# Patient Record
Sex: Female | Born: 1965 | Race: White | Hispanic: No
Health system: Southern US, Community
[De-identification: ages and names within clinical notes are randomized; demographics above are authoritative.]

## PROBLEM LIST (undated history)

## (undated) DIAGNOSIS — J45909 Unspecified asthma, uncomplicated: Secondary | ICD-10-CM

## (undated) DIAGNOSIS — F909 Attention-deficit hyperactivity disorder, unspecified type: Secondary | ICD-10-CM

## (undated) HISTORY — PX: ABDOMINAL HYSTERECTOMY: SHX81

---

## 2017-06-13 DIAGNOSIS — K589 Irritable bowel syndrome without diarrhea: Secondary | ICD-10-CM | POA: Diagnosis not present

## 2017-06-13 DIAGNOSIS — F909 Attention-deficit hyperactivity disorder, unspecified type: Secondary | ICD-10-CM | POA: Diagnosis not present

## 2017-12-12 DIAGNOSIS — K589 Irritable bowel syndrome without diarrhea: Secondary | ICD-10-CM | POA: Diagnosis not present

## 2017-12-12 DIAGNOSIS — F909 Attention-deficit hyperactivity disorder, unspecified type: Secondary | ICD-10-CM | POA: Diagnosis not present

## 2018-03-13 DIAGNOSIS — Z3202 Encounter for pregnancy test, result negative: Secondary | ICD-10-CM | POA: Diagnosis not present

## 2018-03-13 DIAGNOSIS — N390 Urinary tract infection, site not specified: Secondary | ICD-10-CM | POA: Diagnosis not present

## 2018-03-21 DIAGNOSIS — H6692 Otitis media, unspecified, left ear: Secondary | ICD-10-CM | POA: Diagnosis not present

## 2018-05-17 DIAGNOSIS — N390 Urinary tract infection, site not specified: Secondary | ICD-10-CM | POA: Diagnosis not present

## 2018-07-22 DIAGNOSIS — Z23 Encounter for immunization: Secondary | ICD-10-CM | POA: Diagnosis not present

## 2018-07-22 DIAGNOSIS — F909 Attention-deficit hyperactivity disorder, unspecified type: Secondary | ICD-10-CM | POA: Diagnosis not present

## 2018-07-22 DIAGNOSIS — K589 Irritable bowel syndrome without diarrhea: Secondary | ICD-10-CM | POA: Diagnosis not present

## 2018-07-22 DIAGNOSIS — G43909 Migraine, unspecified, not intractable, without status migrainosus: Secondary | ICD-10-CM | POA: Diagnosis not present

## 2018-11-20 DIAGNOSIS — K589 Irritable bowel syndrome without diarrhea: Secondary | ICD-10-CM | POA: Diagnosis not present

## 2018-11-20 DIAGNOSIS — G43909 Migraine, unspecified, not intractable, without status migrainosus: Secondary | ICD-10-CM | POA: Diagnosis not present

## 2018-11-20 DIAGNOSIS — Z23 Encounter for immunization: Secondary | ICD-10-CM | POA: Diagnosis not present

## 2018-11-20 DIAGNOSIS — Z0000 Encounter for general adult medical examination without abnormal findings: Secondary | ICD-10-CM | POA: Diagnosis not present

## 2018-11-20 DIAGNOSIS — F909 Attention-deficit hyperactivity disorder, unspecified type: Secondary | ICD-10-CM | POA: Diagnosis not present

## 2018-11-20 DIAGNOSIS — R5383 Other fatigue: Secondary | ICD-10-CM | POA: Diagnosis not present

## 2018-11-27 DIAGNOSIS — Z13220 Encounter for screening for lipoid disorders: Secondary | ICD-10-CM | POA: Diagnosis not present

## 2018-11-27 DIAGNOSIS — Z131 Encounter for screening for diabetes mellitus: Secondary | ICD-10-CM | POA: Diagnosis not present

## 2018-11-27 DIAGNOSIS — R5383 Other fatigue: Secondary | ICD-10-CM | POA: Diagnosis not present

## 2019-01-22 DIAGNOSIS — F909 Attention-deficit hyperactivity disorder, unspecified type: Secondary | ICD-10-CM | POA: Diagnosis not present

## 2019-01-22 DIAGNOSIS — K589 Irritable bowel syndrome without diarrhea: Secondary | ICD-10-CM | POA: Diagnosis not present

## 2019-04-07 DIAGNOSIS — G43909 Migraine, unspecified, not intractable, without status migrainosus: Secondary | ICD-10-CM | POA: Diagnosis not present

## 2019-04-07 DIAGNOSIS — K589 Irritable bowel syndrome without diarrhea: Secondary | ICD-10-CM | POA: Diagnosis not present

## 2019-04-07 DIAGNOSIS — F909 Attention-deficit hyperactivity disorder, unspecified type: Secondary | ICD-10-CM | POA: Diagnosis not present

## 2019-06-02 DIAGNOSIS — R300 Dysuria: Secondary | ICD-10-CM | POA: Diagnosis not present

## 2019-06-21 DIAGNOSIS — N390 Urinary tract infection, site not specified: Secondary | ICD-10-CM | POA: Diagnosis not present

## 2019-07-07 DIAGNOSIS — R350 Frequency of micturition: Secondary | ICD-10-CM | POA: Diagnosis not present

## 2019-07-07 DIAGNOSIS — R300 Dysuria: Secondary | ICD-10-CM | POA: Diagnosis not present

## 2019-07-07 DIAGNOSIS — R3915 Urgency of urination: Secondary | ICD-10-CM | POA: Diagnosis not present

## 2019-07-14 DIAGNOSIS — R3915 Urgency of urination: Secondary | ICD-10-CM | POA: Diagnosis not present

## 2019-07-18 DIAGNOSIS — N390 Urinary tract infection, site not specified: Secondary | ICD-10-CM | POA: Diagnosis not present

## 2019-07-18 DIAGNOSIS — N952 Postmenopausal atrophic vaginitis: Secondary | ICD-10-CM | POA: Diagnosis not present

## 2019-07-18 DIAGNOSIS — Z87442 Personal history of urinary calculi: Secondary | ICD-10-CM | POA: Diagnosis not present

## 2019-07-18 DIAGNOSIS — Z87440 Personal history of urinary (tract) infections: Secondary | ICD-10-CM | POA: Diagnosis not present

## 2019-07-22 DIAGNOSIS — N200 Calculus of kidney: Secondary | ICD-10-CM | POA: Diagnosis not present

## 2019-12-08 DIAGNOSIS — Z0000 Encounter for general adult medical examination without abnormal findings: Secondary | ICD-10-CM | POA: Diagnosis not present

## 2020-03-03 DIAGNOSIS — S30860A Insect bite (nonvenomous) of lower back and pelvis, initial encounter: Secondary | ICD-10-CM | POA: Diagnosis not present

## 2020-06-15 DIAGNOSIS — K589 Irritable bowel syndrome without diarrhea: Secondary | ICD-10-CM | POA: Diagnosis not present

## 2020-06-15 DIAGNOSIS — G43909 Migraine, unspecified, not intractable, without status migrainosus: Secondary | ICD-10-CM | POA: Diagnosis not present

## 2020-06-15 DIAGNOSIS — F909 Attention-deficit hyperactivity disorder, unspecified type: Secondary | ICD-10-CM | POA: Diagnosis not present

## 2020-06-15 DIAGNOSIS — R030 Elevated blood-pressure reading, without diagnosis of hypertension: Secondary | ICD-10-CM | POA: Diagnosis not present

## 2020-12-13 ENCOUNTER — Other Ambulatory Visit: Payer: Self-pay | Admitting: Family Medicine

## 2020-12-13 DIAGNOSIS — Z131 Encounter for screening for diabetes mellitus: Secondary | ICD-10-CM | POA: Diagnosis not present

## 2020-12-13 DIAGNOSIS — Z0000 Encounter for general adult medical examination without abnormal findings: Secondary | ICD-10-CM | POA: Diagnosis not present

## 2020-12-13 DIAGNOSIS — Z1231 Encounter for screening mammogram for malignant neoplasm of breast: Secondary | ICD-10-CM

## 2020-12-13 DIAGNOSIS — Z13220 Encounter for screening for lipoid disorders: Secondary | ICD-10-CM | POA: Diagnosis not present

## 2021-03-13 DIAGNOSIS — Z20822 Contact with and (suspected) exposure to covid-19: Secondary | ICD-10-CM | POA: Diagnosis not present

## 2021-03-15 DIAGNOSIS — J4520 Mild intermittent asthma, uncomplicated: Secondary | ICD-10-CM | POA: Diagnosis not present

## 2021-03-15 DIAGNOSIS — U071 COVID-19: Secondary | ICD-10-CM | POA: Diagnosis not present

## 2021-06-14 DIAGNOSIS — Z1211 Encounter for screening for malignant neoplasm of colon: Secondary | ICD-10-CM | POA: Diagnosis not present

## 2021-06-14 DIAGNOSIS — F909 Attention-deficit hyperactivity disorder, unspecified type: Secondary | ICD-10-CM | POA: Diagnosis not present

## 2021-06-14 DIAGNOSIS — G43909 Migraine, unspecified, not intractable, without status migrainosus: Secondary | ICD-10-CM | POA: Diagnosis not present

## 2021-06-14 DIAGNOSIS — K589 Irritable bowel syndrome without diarrhea: Secondary | ICD-10-CM | POA: Diagnosis not present

## 2021-06-30 ENCOUNTER — Ambulatory Visit
Admission: RE | Admit: 2021-06-30 | Discharge: 2021-06-30 | Disposition: A | Discharge status: Home / Self Care | Payer: BC Managed Care – PPO | Source: Ambulatory Visit | Attending: Family Medicine | Admitting: Family Medicine

## 2021-06-30 ENCOUNTER — Other Ambulatory Visit: Payer: Self-pay | Admitting: Family Medicine

## 2021-06-30 DIAGNOSIS — M25571 Pain in right ankle and joints of right foot: Secondary | ICD-10-CM

## 2021-06-30 DIAGNOSIS — M79671 Pain in right foot: Secondary | ICD-10-CM

## 2021-07-06 ENCOUNTER — Ambulatory Visit: Payer: BC Managed Care – PPO | Admitting: Family Medicine

## 2021-07-07 ENCOUNTER — Ambulatory Visit: Payer: BC Managed Care – PPO | Admitting: Family Medicine

## 2021-07-07 VITALS — Ht 66.0 in | Wt 131.0 lb

## 2021-07-07 DIAGNOSIS — M5416 Radiculopathy, lumbar region: Secondary | ICD-10-CM

## 2021-07-07 DIAGNOSIS — M25559 Pain in unspecified hip: Secondary | ICD-10-CM

## 2021-07-07 NOTE — Progress Notes (Signed)
PCP: Clayborn Heron, MD  Subjective:   HPI: Patient is a 55 y.o. female here for right leg numbness.  Patient reports for the past few months she's had numbness of right leg below the knee with walking. Typically gets this with her walks that are about 15-20 minutes a day. Wearing good shoes, avoids wearing heels. Has history of back issues and dose have some chronic soreness here. Numbness in right leg always below the knee, into toes but not a consistent distribution. Also noticed pain in right hip with rotation of her leg. Radiates down leg some. Has tried stretches, ibuprofen without much relief.  No past medical history on file.  No current outpatient medications on file prior to visit.   No current facility-administered medications on file prior to visit.   Allergies  Allergen Reactions   Codeine Other (See Comments)    Syncopal episodes    Ht 5\' 6"  (1.676 m)   Wt 131 lb (59.4 kg)   BMI 21.14 kg/m   [facility] 07/07/2021  Frequency of aerobic exercise (# of days/week) 7  Average time in minutes 20  Frequency of strengthening activities (# of days/week) 0    No flowsheet data found.      Objective:  Physical Exam:  Gen: NAD, comfortable in exam room  Back: No gross deformity, scoliosis. TTP mildly paraspinal lumbar regions.  No midline or bony TTP. FROM. Strength LEs 5/5 all muscle groups.   2+ MSRs in patellar and achilles tendons, equal bilaterally. Negative SLRs.  Positive slump. Sensation intact to light touch bilaterally.  Right hip: No deformity. FROM with 5/5 strength. No tenderness to palpation. NVI distally. Positive logroll, fadir. Negative faber, and piriformis stretches.   Assessment & Plan:  1. Right leg numbness - consistent with lumbar radiculopathy.  Start aleve, formal physical therapy.  Consider prednisone, rx nsaid, imaging if not improving.  2. Right hip pain - likely 2/2 osteoarthritis, less  likely labral tear.  Add physical therapy for this also.  Aleve, reviewed tylenol, topical medications, supplements.  F/u in 5 weeks for both issues.

## 2021-07-07 NOTE — Patient Instructions (Signed)
Your numbness/tingling is coming from an irritated nerve in your back. Start aleve 2 tabs twice a day with food for pain and inflammation - take this for 7-10 days then as needed. Stay as active as possible - ok to continue walking with this. Physical therapy has been shown to be helpful as well - start this and do home exercises on days you don't go to therapy. Strengthening of low back muscles, abdominal musculature are key for long term pain relief. Follow up with me in 5 weeks.  You are also having pain from your right hip separately from this, most likely due to mild arthritis. We will add therapy for this as well and the aleve will help. Other medicines that can help if needed: Tylenol 500mg  1-2 tabs three times a day for pain. Capsaicin, aspercreme, or biofreeze topically up to four times a day may also help with pain. Some supplements that may help for arthritis: Boswellia extract, curcumin, pycnogenol

## 2021-08-10 ENCOUNTER — Ambulatory Visit: Payer: BC Managed Care – PPO | Admitting: Family Medicine

## 2021-12-28 DIAGNOSIS — Z0000 Encounter for general adult medical examination without abnormal findings: Secondary | ICD-10-CM | POA: Diagnosis not present

## 2022-01-06 DIAGNOSIS — Z131 Encounter for screening for diabetes mellitus: Secondary | ICD-10-CM | POA: Diagnosis not present

## 2022-01-06 DIAGNOSIS — Z13220 Encounter for screening for lipoid disorders: Secondary | ICD-10-CM | POA: Diagnosis not present

## 2022-03-05 DIAGNOSIS — R300 Dysuria: Secondary | ICD-10-CM | POA: Diagnosis not present

## 2022-03-05 DIAGNOSIS — N3090 Cystitis, unspecified without hematuria: Secondary | ICD-10-CM | POA: Diagnosis not present

## 2022-03-05 DIAGNOSIS — N390 Urinary tract infection, site not specified: Secondary | ICD-10-CM | POA: Diagnosis not present

## 2022-04-03 DIAGNOSIS — F900 Attention-deficit hyperactivity disorder, predominantly inattentive type: Secondary | ICD-10-CM | POA: Diagnosis not present

## 2022-04-03 DIAGNOSIS — F419 Anxiety disorder, unspecified: Secondary | ICD-10-CM | POA: Diagnosis not present

## 2022-04-03 DIAGNOSIS — Z79899 Other long term (current) drug therapy: Secondary | ICD-10-CM | POA: Diagnosis not present

## 2022-06-01 DIAGNOSIS — F419 Anxiety disorder, unspecified: Secondary | ICD-10-CM | POA: Diagnosis not present

## 2022-06-01 DIAGNOSIS — F900 Attention-deficit hyperactivity disorder, predominantly inattentive type: Secondary | ICD-10-CM | POA: Diagnosis not present

## 2022-06-30 DIAGNOSIS — F900 Attention-deficit hyperactivity disorder, predominantly inattentive type: Secondary | ICD-10-CM | POA: Diagnosis not present

## 2022-06-30 DIAGNOSIS — F419 Anxiety disorder, unspecified: Secondary | ICD-10-CM | POA: Diagnosis not present

## 2022-08-29 DIAGNOSIS — F419 Anxiety disorder, unspecified: Secondary | ICD-10-CM | POA: Diagnosis not present

## 2022-08-29 DIAGNOSIS — F900 Attention-deficit hyperactivity disorder, predominantly inattentive type: Secondary | ICD-10-CM | POA: Diagnosis not present

## 2022-12-01 DIAGNOSIS — F419 Anxiety disorder, unspecified: Secondary | ICD-10-CM | POA: Diagnosis not present

## 2022-12-01 DIAGNOSIS — F900 Attention-deficit hyperactivity disorder, predominantly inattentive type: Secondary | ICD-10-CM | POA: Diagnosis not present

## 2023-03-01 DIAGNOSIS — F900 Attention-deficit hyperactivity disorder, predominantly inattentive type: Secondary | ICD-10-CM | POA: Diagnosis not present

## 2023-03-01 DIAGNOSIS — F419 Anxiety disorder, unspecified: Secondary | ICD-10-CM | POA: Diagnosis not present

## 2023-03-01 DIAGNOSIS — Z5181 Encounter for therapeutic drug level monitoring: Secondary | ICD-10-CM | POA: Diagnosis not present

## 2023-05-30 DIAGNOSIS — F900 Attention-deficit hyperactivity disorder, predominantly inattentive type: Secondary | ICD-10-CM | POA: Diagnosis not present

## 2023-05-30 DIAGNOSIS — F419 Anxiety disorder, unspecified: Secondary | ICD-10-CM | POA: Diagnosis not present

## 2023-08-28 DIAGNOSIS — F419 Anxiety disorder, unspecified: Secondary | ICD-10-CM | POA: Diagnosis not present

## 2023-08-28 DIAGNOSIS — F900 Attention-deficit hyperactivity disorder, predominantly inattentive type: Secondary | ICD-10-CM | POA: Diagnosis not present

## 2023-09-17 IMAGING — DX DG FOOT COMPLETE 3+V*R*
3 series · 3 of 3 positions shown · non-contrast
Comparison: None.

CLINICAL DATA: Right foot pain. Dropped weight on foot 3 months
ago.

EXAM:
RIGHT FOOT COMPLETE - 3+ VIEW

[dg foot complete right (1 of 3)]
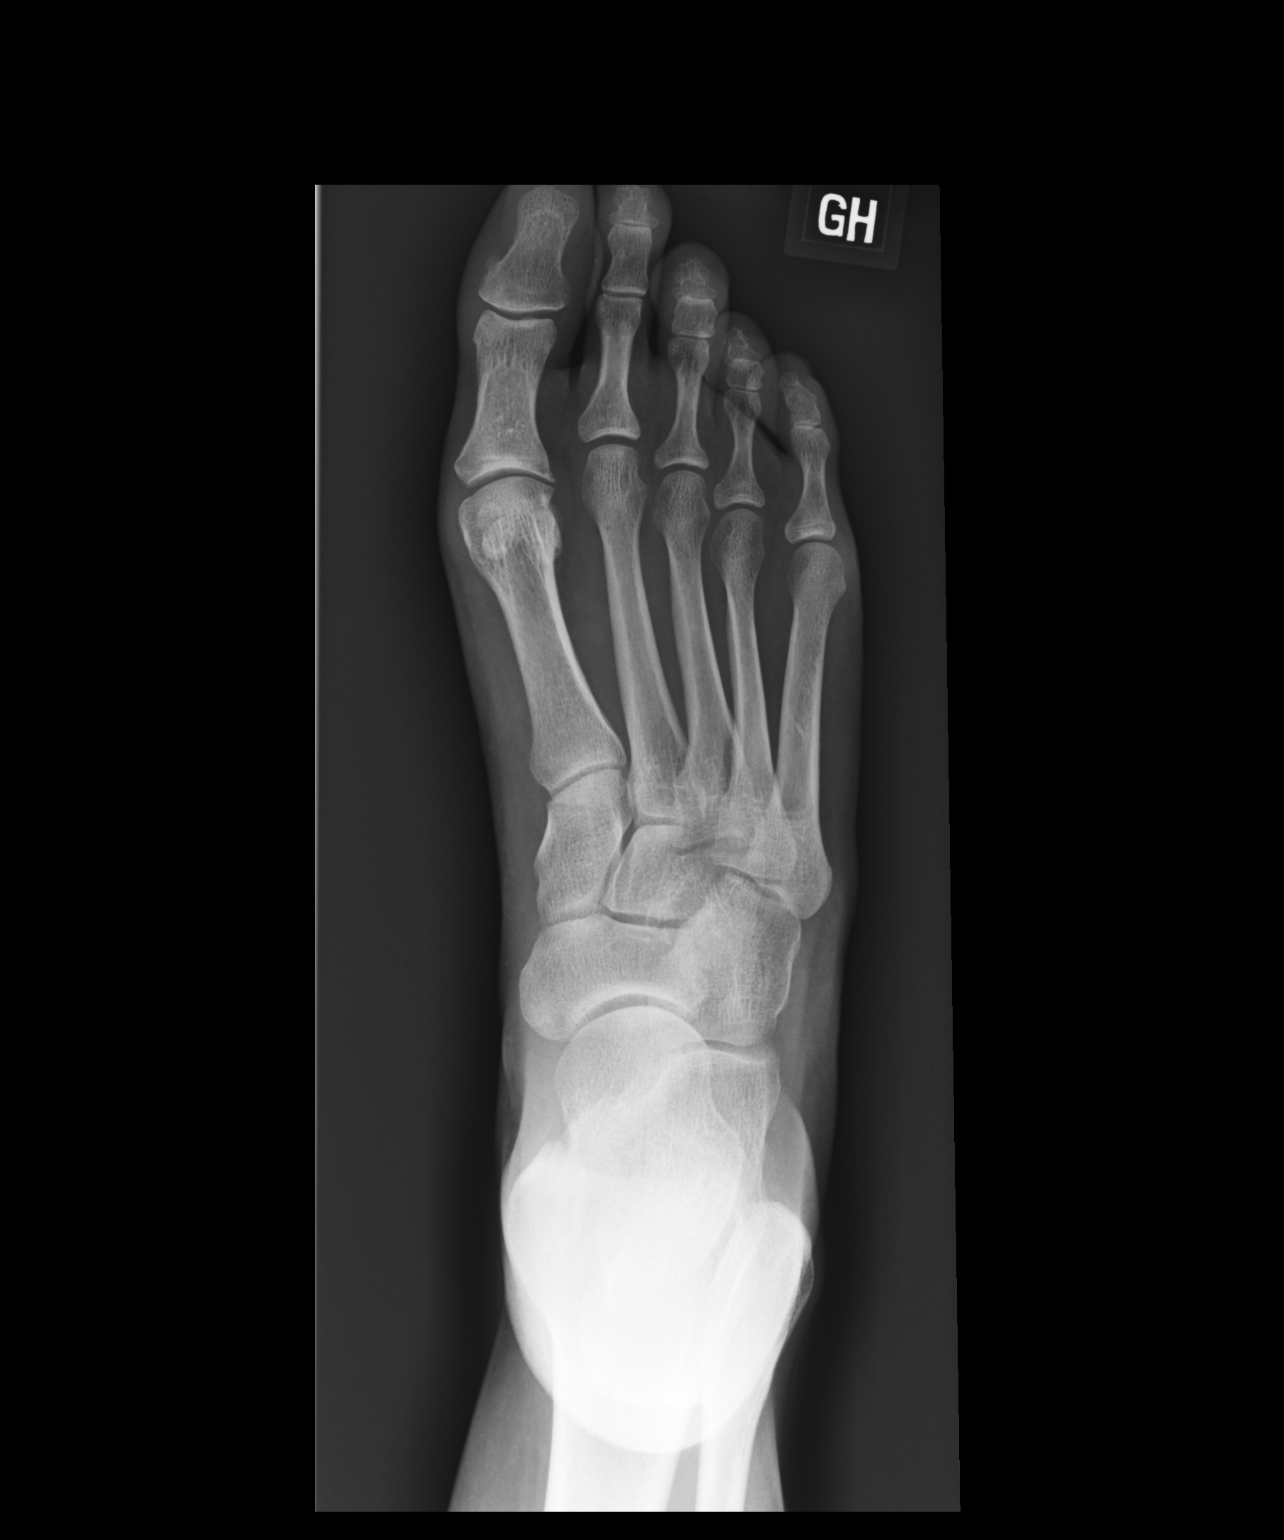

[dg foot complete right (2 of 3)]
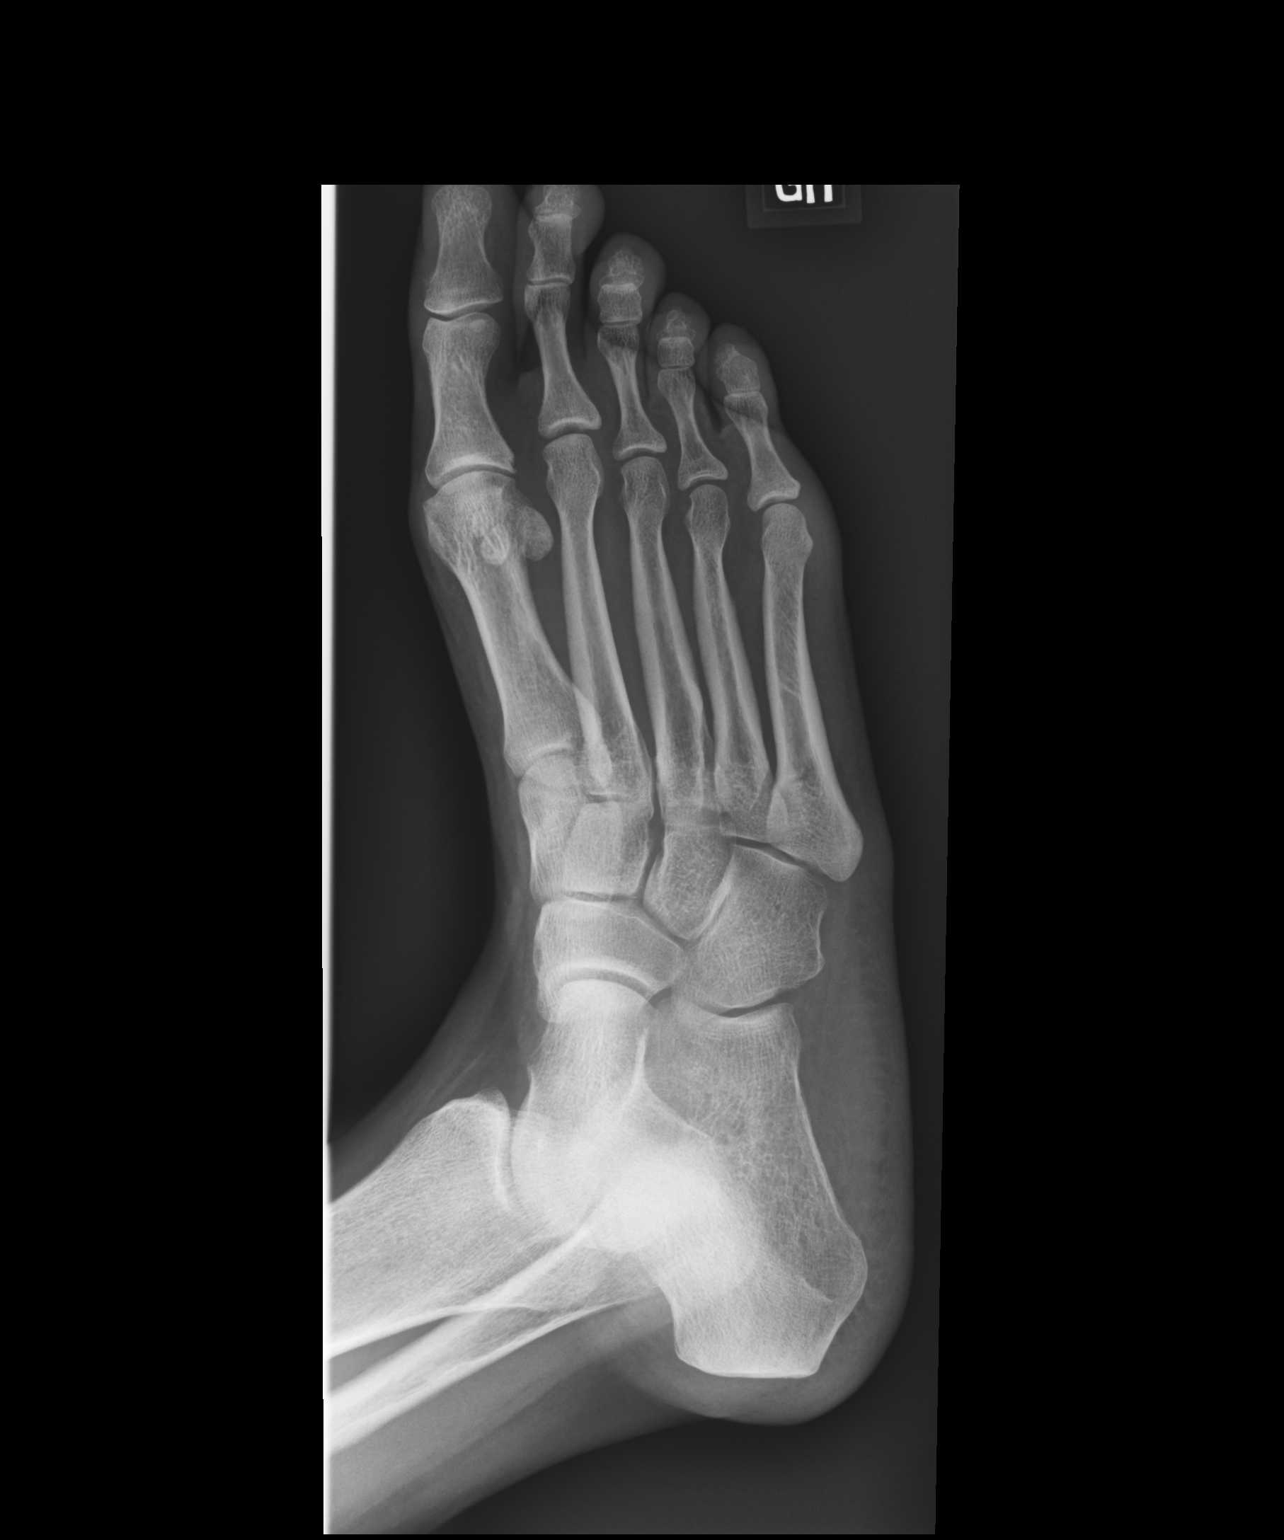

[dg foot complete right (3 of 3)]
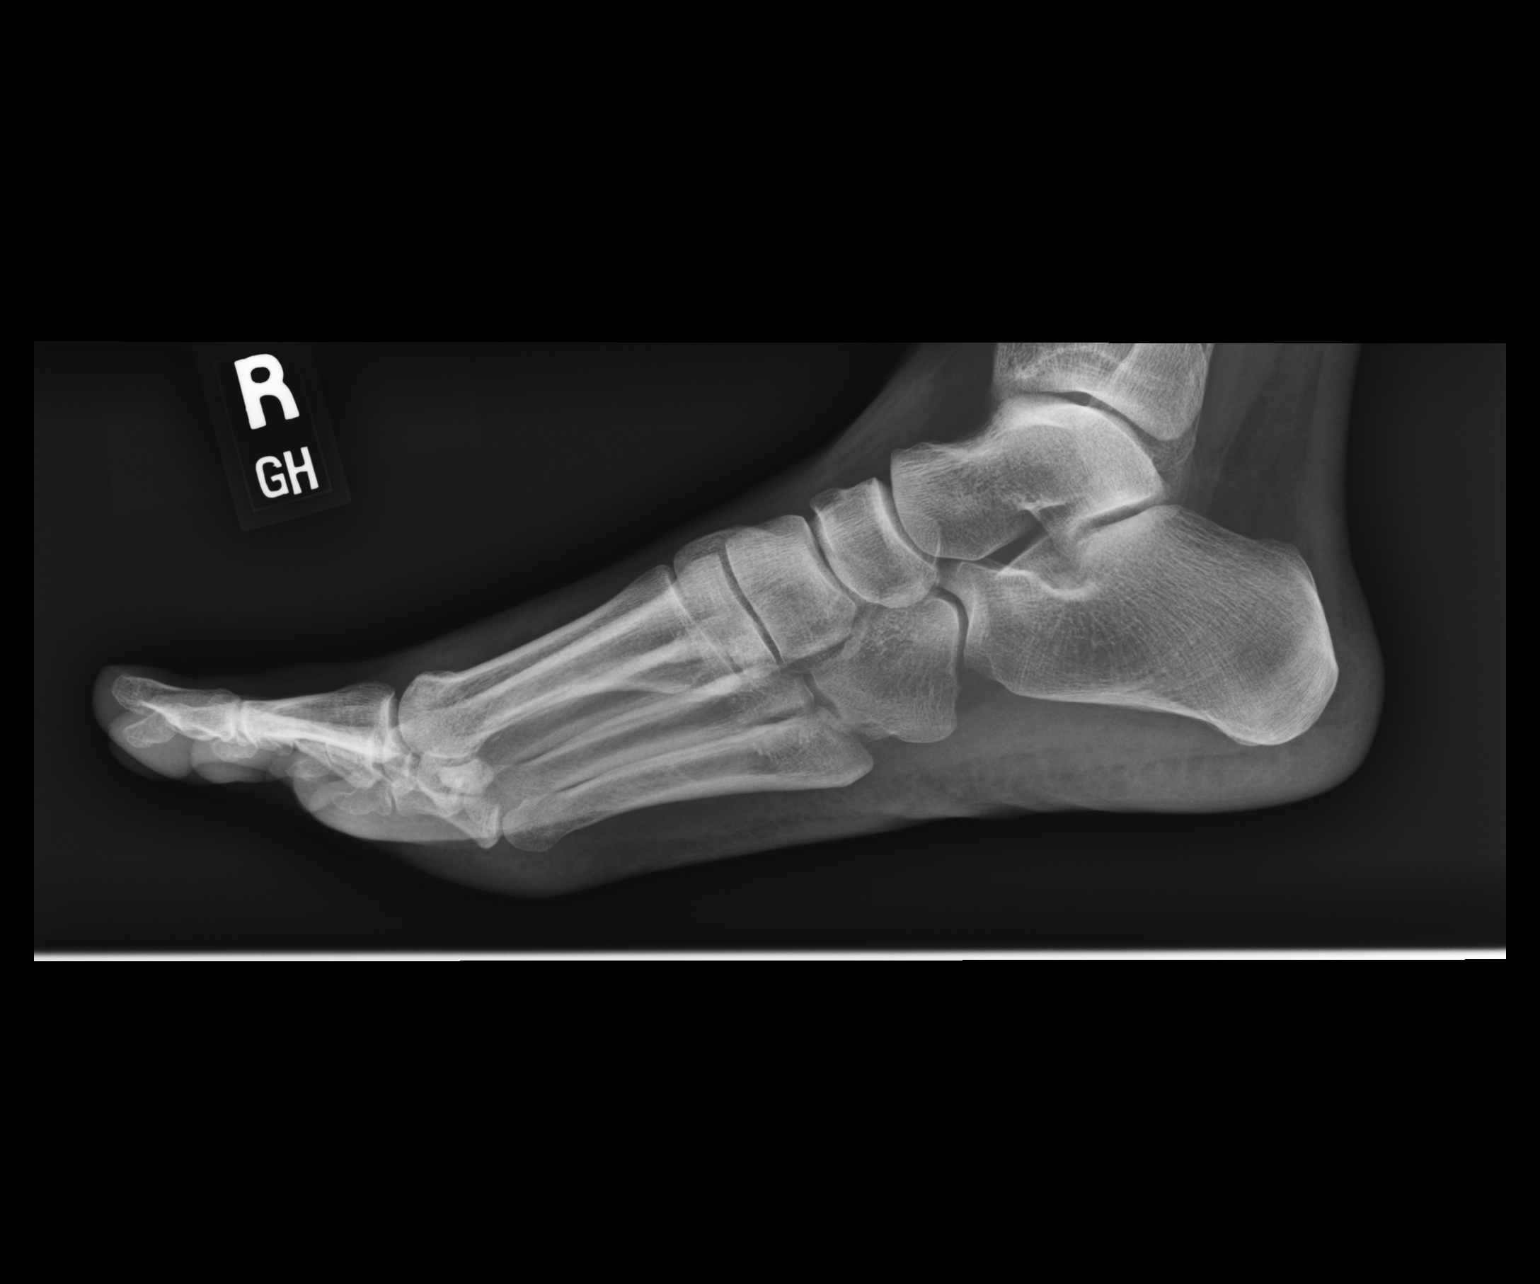

[3 of 3 positions shown; findings below may reference images not displayed]

FINDINGS: Negative for fracture. Mild degenerative change first MTP joint. No
bony erosion identified.
IMPRESSION: No acute abnormality.

## 2023-09-17 IMAGING — DX DG ANKLE COMPLETE 3+V*R*
3 series · 3 of 3 positions shown · non-contrast
Comparison: None.

CLINICAL DATA: Right ankle pain

EXAM:
RIGHT ANKLE - COMPLETE 3+ VIEW

[dg ankle complete right (1 of 3)]
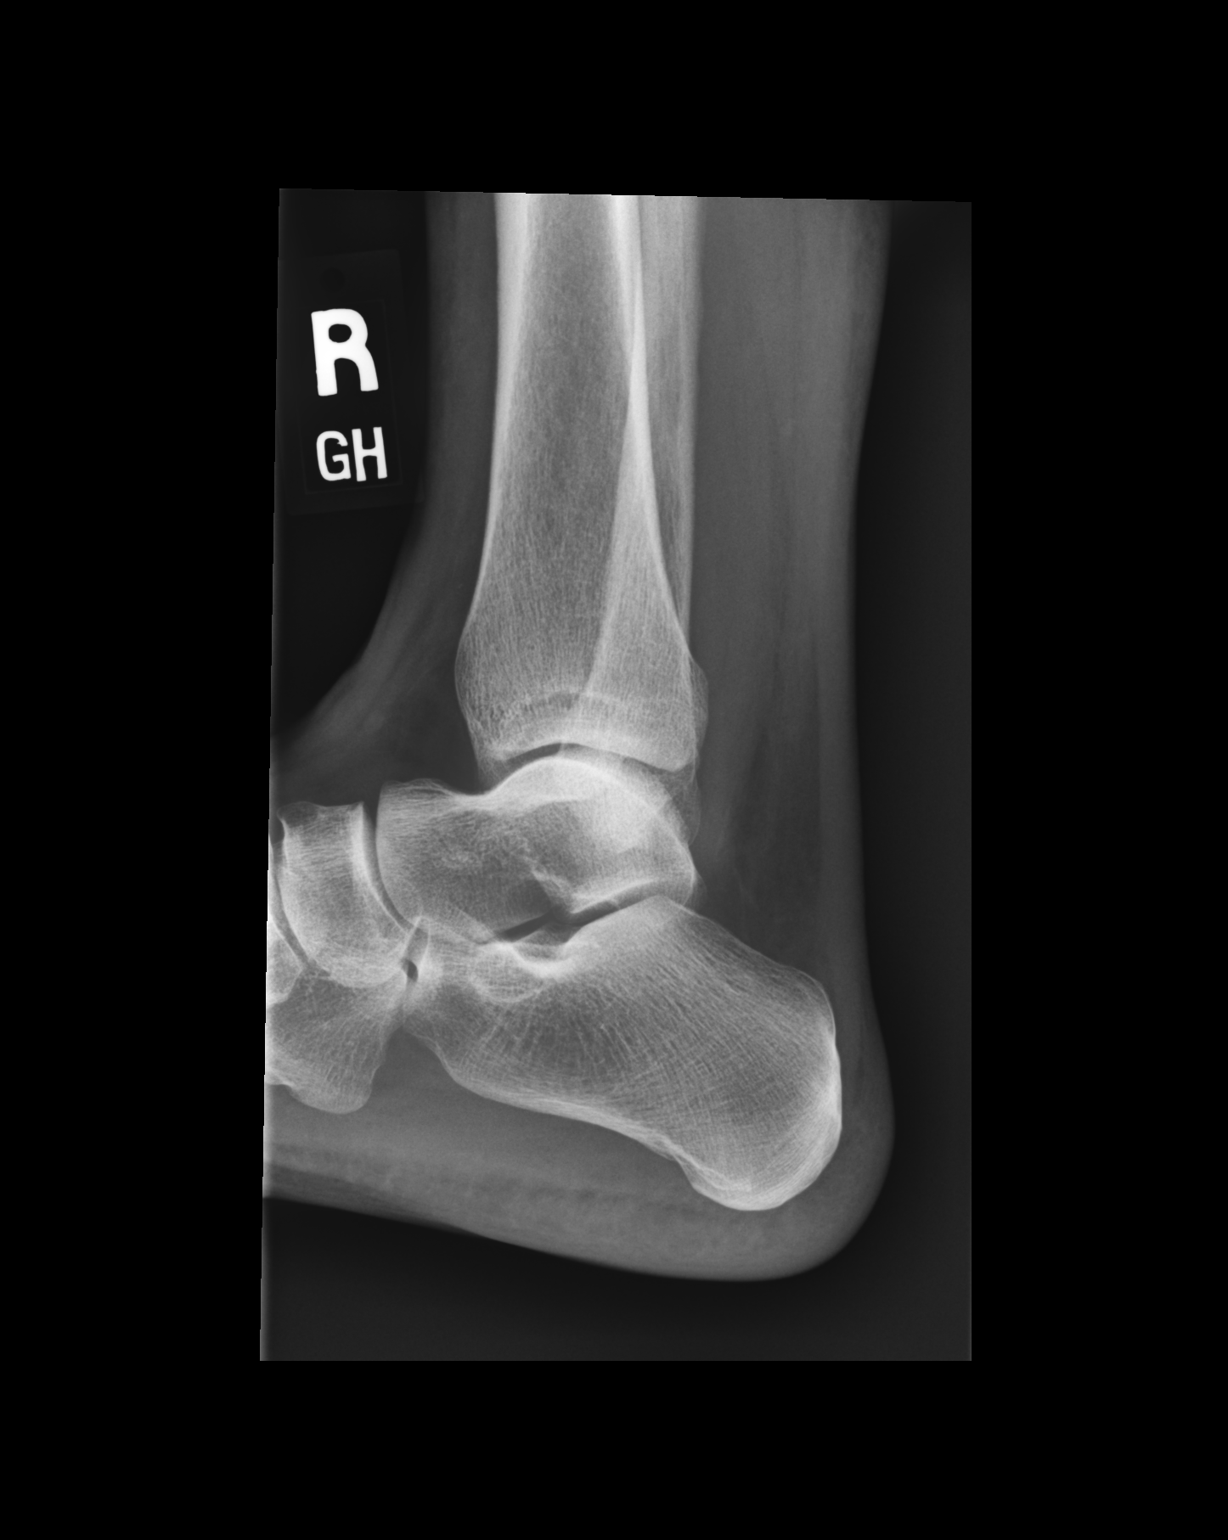

[dg ankle complete right (2 of 3)]
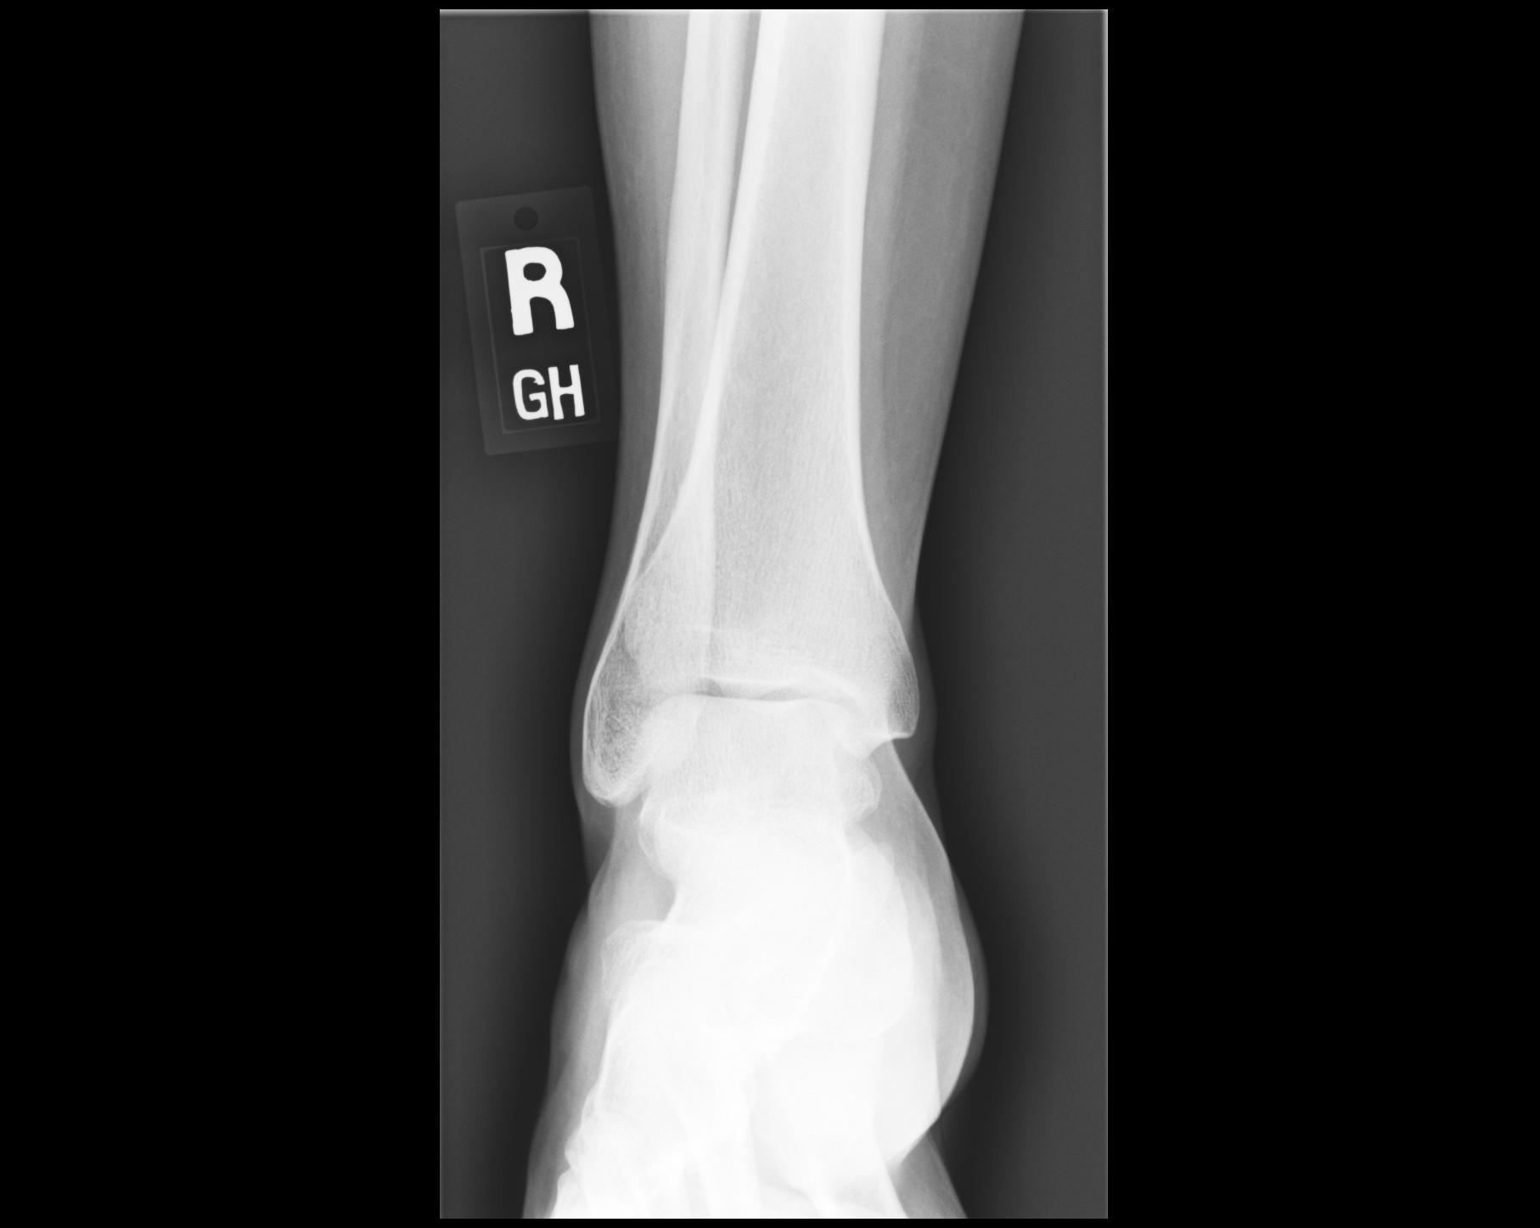

[dg ankle complete right (3 of 3)]
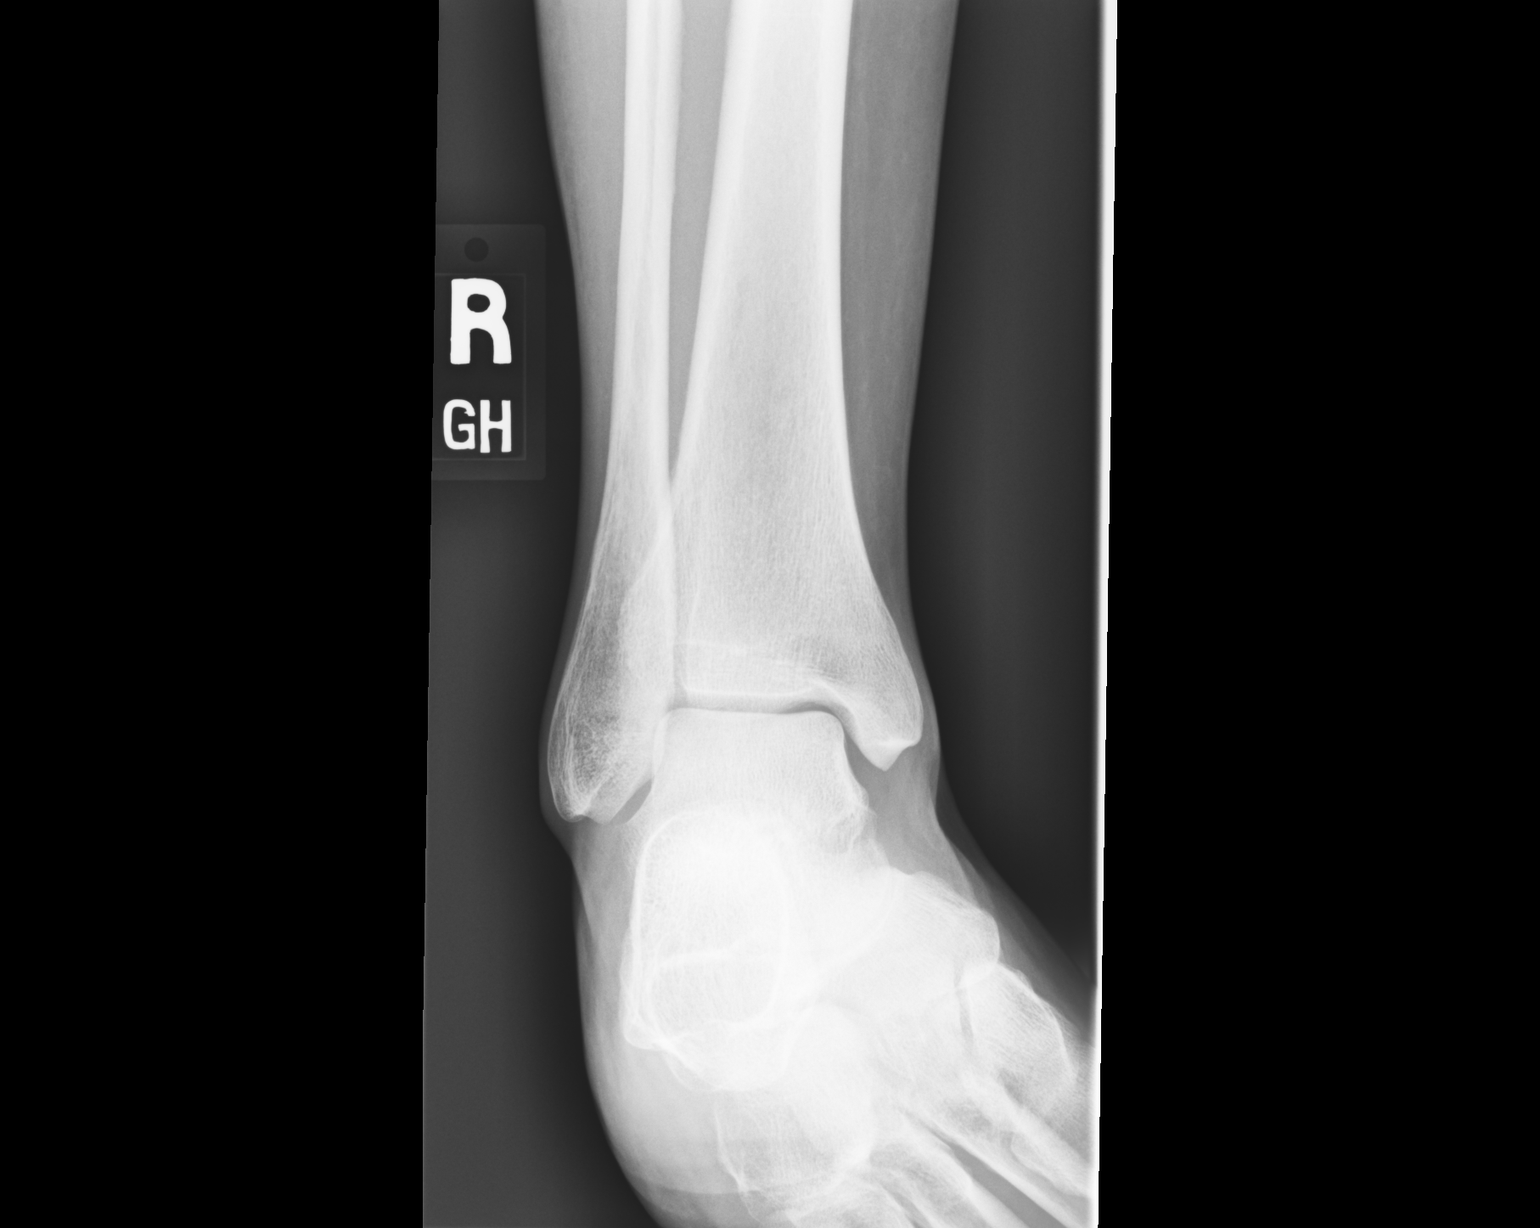

[3 of 3 positions shown; findings below may reference images not displayed]

FINDINGS: There is no evidence of fracture, dislocation, or joint effusion.
There is no evidence of arthropathy or other focal bone abnormality.
Soft tissues are unremarkable.
IMPRESSION: Negative.

## 2023-12-07 ENCOUNTER — Encounter (HOSPITAL_COMMUNITY): Payer: Self-pay

## 2023-12-07 ENCOUNTER — Ambulatory Visit (HOSPITAL_COMMUNITY)
Admission: EM | Admit: 2023-12-07 | Discharge: 2023-12-07 | Disposition: A | Discharge status: Home / Self Care | Payer: BC Managed Care – PPO

## 2023-12-07 DIAGNOSIS — R590 Localized enlarged lymph nodes: Secondary | ICD-10-CM

## 2023-12-07 HISTORY — DX: Unspecified asthma, uncomplicated: J45.909

## 2023-12-07 HISTORY — DX: Attention-deficit hyperactivity disorder, unspecified type: F90.9

## 2023-12-07 MED ORDER — PREDNISOLONE SODIUM PHOSPHATE 15 MG/5ML PO SOLN
40.0000 mg | Freq: Once | ORAL | Status: AC
  Administered 2023-12-07: 40 mg via ORAL

## 2023-12-07 MED ORDER — PREDNISOLONE SODIUM PHOSPHATE 15 MG/5ML PO SOLN
ORAL | Status: AC
  Filled 2023-12-07: qty 3

## 2023-12-07 NOTE — ED Triage Notes (Signed)
 Chief Complaint: Swollen glands in the neck, fever, chills. Denies congestion and cough.   Sick exposure: No  Onset: 2 days   Prescriptions or OTC medications tried: Yes- tylenol     with moderate relief  New foods, medications, or products: No  Recent Travel: No

## 2023-12-07 NOTE — ED Provider Notes (Addendum)
 [facility]    CSN: [id number] Arrival date & time: 12/07/23  0865      History   Chief Complaint Chief Complaint  Patient presents with   Fever    HPI Michaela Whitaker is a 58 y.o. female.   Patient presents to clinic over concern of a swollen area or to her right anterior lower neck over the past few days.  She has also had fever, chills and some congestion.  Diminished appetite.  Mild cough.  Denies any recent sick contacts.  Fever at home has ranged from 101-102.  Has been taking Tylenol with some improvement.  Denies wheezing or shortness of breath.  She does not smoke.  No history of thyroid issues.    The history is provided by the patient and medical records.  Fever   Past Medical History:  Diagnosis Date   ADHD    Asthma     There are no active problems to display for this patient.   Past Surgical History:  Procedure Laterality Date   ABDOMINAL HYSTERECTOMY     partial    OB History   No obstetric history on file.      Home Medications    Prior to Admission medications   Medication Sig Start Date End Date Taking? Authorizing Provider  amphetamine-dextroamphetamine (ADDERALL XR) 20 MG 24 hr capsule  07/16/19  Yes [provider]  buPROPion (WELLBUTRIN XL) 150 MG 24 hr tablet  07/11/19  Yes [provider]    Family History History reviewed. No pertinent family history.  Social History Social History   Tobacco Use   Smoking status: Never   Smokeless tobacco: Never  Vaping Use   Vaping status: Never Used  Substance Use Topics   Alcohol use: Not Currently   Drug use: Never     Allergies   Codeine   Review of Systems Review of Systems  Per HPI   Physical Exam Triage Vital Signs ED Triage Vitals  Encounter Vitals Group     BP 12/07/23 1034 (!) 151/83     Systolic BP Percentile --      Diastolic BP Percentile --      Pulse Rate 12/07/23 1034 (!) 102     Resp 12/07/23 1034 18     Temp 12/07/23  1034 98.8 F (37.1 C)     Temp Source 12/07/23 1034 Oral     SpO2 12/07/23 1034 94 %     Weight 12/07/23 1033 126 lb (57.2 kg)     Height 12/07/23 1033 5\' 6"  (1.676 m)     Head Circumference --      Peak Flow --      Pain Score 12/07/23 1031 3     Pain Loc --      Pain Education --      Exclude from Growth Chart --    No data found.  Updated Vital Signs BP (!) 151/83 (BP Location: Left Arm)   Pulse (!) 102   Temp 98.8 F (37.1 C) (Oral)   Resp 18   Ht 5\' 6"  (1.676 m)   Wt 126 lb (57.2 kg)   SpO2 94%   BMI 20.34 kg/m   Visual Acuity Right Eye Distance:   Left Eye Distance:   Bilateral Distance:    Right Eye Near:   Left Eye Near:    Bilateral Near:     Physical Exam Vitals and nursing note reviewed.  Constitutional:      Appearance: Normal  appearance.  HENT:     Head: Normocephalic and atraumatic.      Right Ear: External ear normal.     Left Ear: External ear normal.     Nose: Congestion and rhinorrhea present.     Mouth/Throat:     Mouth: Mucous membranes are moist.     Pharynx: Uvula midline. Posterior oropharyngeal erythema and postnasal drip present.  Eyes:     Conjunctiva/sclera: Conjunctivae normal.  Neck:      Comments: Large right-sided anterior cervical lymphadenopathy.  Lymph node is firm and tender to palpation.  No erythema of surrounding skin.  Generalized lymphadenopathy of the submandibular, submental and posterior cervical chain as well. Cardiovascular:     Rate and Rhythm: Normal rate and regular rhythm.     Heart sounds: Normal heart sounds. No murmur heard. Pulmonary:     Effort: Pulmonary effort is normal. No respiratory distress.     Breath sounds: Normal breath sounds.  Musculoskeletal:        General: Normal range of motion.  Lymphadenopathy:     Cervical: Cervical adenopathy present.  Skin:    General: Skin is warm and dry.  Neurological:     General: No focal deficit present.     Mental Status: She is alert and oriented to  person, place, and time.  Psychiatric:        Mood and Affect: Mood normal.        Behavior: Behavior normal. Behavior is cooperative.      UC Treatments / Results  Labs (all labs ordered are listed, but only abnormal results are displayed) Labs Reviewed - No data to display  EKG   Radiology No results found.  Procedures Procedures (including critical care time)  Medications Ordered in UC Medications  prednisoLONE (ORAPRED) 15 MG/5ML solution 40 mg (has no administration in time range)    Initial Impression / Assessment and Plan / UC Course  I have reviewed the triage vital signs and the nursing notes.  Pertinent labs & imaging results that were available during my care of the patient were reviewed by me and considered in my medical decision making (see chart for details).  Vitals and triage reviewed, patient is hemodynamically stable. Large right anterior cervical lymphadenopathy, most likely in the setting of viral URI.  Generalized cervical, tonsillar and submandibular lymphadenopathy present throughout.  Symptomatic management discussed.  Will trial one-time dose of liquid steroid here in clinic to see if we can improve the pain and inflammation.  Discussed the need of advanced imaging if symptoms evolve, or no improvement over the course of her viral illness, does not have a PCP so she should seek follow-up care at the nearest emergency department if this occurs.  Patient verbalized understanding.  Plan of care, follow-up care return precautions given, no questions at this time.     Final Clinical Impressions(s) / UC Diagnoses   Final diagnoses:  Lymphadenopathy, anterior cervical     Discharge Instructions      You have lymph node swelling in your neck, this is most likely reactive to a viral illness.  Alternate between 500 mg of ibuprofen and 800 mg of Tylenol every 4-6 hours.  You also do warm saline gargles.  Your lymph node swelling should improve as your  illness resolves, most likely over the next 5 to 7 days.  If you notice that it does not improve, gets larger or is impeding on your ability to swallow, please seek further care at the nearest  emergency department.  It is important to establish with a primary care provider for routine evaluation.    ED Prescriptions   None    PDMP not reviewed this encounter.   Karron Goens, [place] N, FNP 12/07/23 1100    Rinaldo Ratel [place] N, [place] 12/07/23 1116

## 2023-12-07 NOTE — Discharge Instructions (Signed)
 You have lymph node swelling in your neck, this is most likely reactive to a viral illness.  Alternate between 500 mg of ibuprofen and 800 mg of Tylenol every 4-6 hours.  You also do warm saline gargles.  Your lymph node swelling should improve as your illness resolves, most likely over the next 5 to 7 days.  If you notice that it does not improve, gets larger or is impeding on your ability to swallow, please seek further care at the nearest emergency department.  It is important to establish with a primary care provider for routine evaluation.

## 2023-12-24 ENCOUNTER — Other Ambulatory Visit: Payer: Self-pay | Admitting: Family Medicine

## 2023-12-24 DIAGNOSIS — E041 Nontoxic single thyroid nodule: Secondary | ICD-10-CM

## 2023-12-27 ENCOUNTER — Ambulatory Visit
Admission: RE | Admit: 2023-12-27 | Discharge: 2023-12-27 | Discharge status: Home / Self Care | Source: Ambulatory Visit | Attending: Family Medicine | Admitting: Family Medicine

## 2023-12-27 DIAGNOSIS — E041 Nontoxic single thyroid nodule: Secondary | ICD-10-CM

## 2024-01-16 ENCOUNTER — Ambulatory Visit: Admitting: Allergy

## 2024-02-08 ENCOUNTER — Ambulatory Visit: Admitting: Internal Medicine
